# Patient Record
Sex: Male | Born: 1980
Health system: Southern US, Community
[De-identification: ages and names within clinical notes are randomized; demographics above are authoritative.]

---
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
## 2002-11-14 ENCOUNTER — Encounter: Admission: RE | Admit: 2002-11-14 | Discharge: 2002-11-14 | Payer: Self-pay | Admitting: Family Medicine

## 2003-06-27 ENCOUNTER — Encounter: Admission: RE | Admit: 2003-06-27 | Discharge: 2003-06-27 | Payer: Self-pay | Admitting: Family Medicine

## 2007-02-07 ENCOUNTER — Ambulatory Visit: Payer: Self-pay | Admitting: Family Medicine

## 2007-02-08 ENCOUNTER — Encounter: Payer: Self-pay | Admitting: Family Medicine

## 2007-02-08 LAB — CONVERTED CEMR LAB

## 2007-02-28 ENCOUNTER — Ambulatory Visit: Payer: Self-pay | Admitting: Family Medicine

## 2007-02-28 DIAGNOSIS — F17200 Nicotine dependence, unspecified, uncomplicated: Secondary | ICD-10-CM

## 2007-09-06 ENCOUNTER — Ambulatory Visit: Payer: Self-pay | Admitting: Family Medicine

## 2008-12-14 ENCOUNTER — Ambulatory Visit: Payer: Self-pay | Admitting: Family Medicine

## 2008-12-31 ENCOUNTER — Encounter: Payer: Self-pay | Admitting: Family Medicine

## 2009-08-07 ENCOUNTER — Ambulatory Visit: Payer: Self-pay | Admitting: Family Medicine

## 2009-08-07 DIAGNOSIS — R109 Unspecified abdominal pain: Secondary | ICD-10-CM | POA: Insufficient documentation

## 2009-08-07 LAB — CONVERTED CEMR LAB
ALT: 26 units/L (ref 0–53)
AST: 23 units/L (ref 0–37)
Albumin: 4.5 g/dL (ref 3.5–5.2)
Alkaline Phosphatase: 69 units/L (ref 39–117)
Glucose, Bld: 94 mg/dL (ref 70–99)
MCHC: 34.1 g/dL (ref 30.0–36.0)
Platelets: 236 10*3/uL (ref 150–400)
Potassium: 4.1 meq/L (ref 3.5–5.3)
RBC: 4.81 M/uL (ref 4.22–5.81)
Sodium: 135 meq/L (ref 135–145)
Total Protein: 7 g/dL (ref 6.0–8.3)

## 2009-09-18 ENCOUNTER — Ambulatory Visit: Payer: Self-pay | Admitting: Family Medicine

## 2010-03-06 NOTE — Assessment & Plan Note (Signed)
Summary: intestinal prob,df   Vital Signs:  Patient profile:   30 year old male Height:      72 inches Weight:      187.4 pounds BMI:     25.51 Temp:     98.3 degrees F oral Pulse rate:   94 / minute BP sitting:   124 / 76  (left arm) Cuff size:   regular  Vitals Entered By: Garen Grams LPN (August 07, 9145 8:51 AM) CC: GI Issues Is Patient Diabetic? No Pain Assessment Patient in pain? no        CC:  GI Issues.  History of Present Illness: Abdominal discomfort continues to have feeling of constipation being unable to fully evacuate his bowels and deep ache in lower abdominal that is relieved with bowel movements.  Since last visit has changed his diet to include more fruits and veggies and has intentionally lost weight.  His bowel movements are usually soft although can be rabbit like pellets.  No hard painful bowel movements or any bleeding.   No fevers or other types of abdominal pain.  No skin lesions.  Swelling in left testicle has noiticed a slightly tender slightly enlarged L epidydimus over last week.  No sexual contacts or penild discharge or groin swellling or skin lesions or dysyuria    ROS - as above PMH - Medications reviewed and updated in medication list.  Smoking Status noted in VS form    Habits & Providers  Alcohol-Tobacco-Diet     Tobacco Status: current     Tobacco Counseling: to quit use of tobacco products     Cigarette Packs/Day: 0.5  Current Medications (verified): 1)  Ciprofloxacin Hcl 250 Mg Tabs (Ciprofloxacin Hcl) .Marland Kitchen.. 1 By Mouth Two Times A Day  Allergies: No Known Drug Allergies  Social History: Packs/Day:  0.5  Physical Exam  General:  Well-developed,well-nourished,in no acute distress; alert,appropriate and cooperative throughout examination Abdomen:  Bowel sounds positive,abdomen soft and non-tender without masses, organomegaly or hernias noted. Genitalia:  L testicle no masses  Perhaps slightly enlarged epidydimus mildly tender  no erythema R testicle normal     Impression & Recommendations:  Problem # 1:  ABDOMINAL PAIN (ICD-789.00) think most consistent with IBS which he has read about and agrees.  No red flags but will check labs and keep bowel movements diary  Orders: Comp Met-[facility] (82956-21308) CBC-[facility] (65784) [facility]- Est  Level 4 (69629)  Problem # 2:  EPIDIDYMITIS (ICD-604.90) Assessment: New  perhaps mild case.  No sex exposure so will treat with cipro for a course.    Orders: [facility]- Est  Level 4 (52841)  Problem # 3:  TOBACCO USE (ICD-305.1) discussed reasons for smoking and possible substitutes  Complete Medication List: 1)  Ciprofloxacin Hcl 250 Mg Tabs (Ciprofloxacin hcl) .Marland Kitchen.. 1 by mouth two times a day  Patient Instructions: 1)  Please schedule a follow-up appointment in 1 month.  2)  Keep a stool diary - start in 2 weeks - write down each bowel movements - if hard or loose - if any blood.  Also write down times of the most discomfort 3)  I will call you if your lab is abnormal otherwise I will discuss the results during our next visit 4)  Take all the ciprofloxacin - two times a day for 7 days.  If the testicle becomes red orverypainful then call 5)  Consider substitutes for smoking  Prescriptions: CIPROFLOXACIN HCL 250 MG TABS (CIPROFLOXACIN HCL) 1 by mouth two times  a day  #14 x 0   Entered and Authorized by:   Pearlean Brownie MD   Signed by:   Pearlean Brownie MD on 08/07/2009   Method used:   Print then Give to Patient   RxID:   [id number]

## 2010-03-06 NOTE — Assessment & Plan Note (Signed)
Summary: f/up,tcb   Vital Signs:  Patient profile:   30 year old male Height:      72 inches Weight:      188 pounds BMI:     25.59 BSA:     2.08 Temp:     98.2 degrees F Pulse rate:   76 / minute BP sitting:   137 / 74  Vitals Entered By: Jone Baseman CMA (September 18, 2009 11:39 AM) CC: f/u Is Patient Diabetic? No Pain Assessment Patient in pain? no        CC:  f/u.  History of Present Illness: Abdominal pain brought in diary.  Having bowel movements daily of varying consistency and size but no bleeding or diarrhea.  The pain he describes as achy bilateral not very severe.  Does not prevent him from doing anything and he would not take mediine to help it  Smoking  continues 1/2 ppd would like to not smoke but not necesarily want to quit.  First cig is usually on the drive to work.  Understands the health risks  ROS - as above PMH - Medications reviewed and updated in medication list.  Smoking Status noted in VS form    Habits & Providers  Alcohol-Tobacco-Diet     Tobacco Status: current     Tobacco Counseling: to quit use of tobacco products     Cigarette Packs/Day: 0.5  Current Medications (verified): 1)  None  Allergies: No Known Drug Allergies  Physical Exam  General:  Well-developed,well-nourished,in no acute distress; alert,appropriate and cooperative throughout examination   Impression & Recommendations:  Problem # 1:  ABDOMINAL PAIN (ICD-789.00)  very consistent with IBS.  Discussed need for regular fiber and exercise.  No medications desired currently  Orders: [facility]- Est Level  3 (99213)  Problem # 2:  EPIDIDYMITIS (ICD-604.90) resolved  Problem # 3:  TOBACCO USE (ICD-305.1)  discussed reasons and strategies  Orders: [facility]- Est Level  3 (30865)  Problem # 4:  Preventive Health Care (ICD-V70.0) encouraged exercise possibly at local racquet ball court   Problem # 5:  ELEVATED BLOOD PRESSURE WITHOUT DIAGNOSIS OF HYPERTENSION  (ICD-796.2) BP under control last few visits BP today: 137/74 Prior BP: 124/76 (08/07/2009)  Labs Reviewed: Creat: 1.14 (08/07/2009)  Patient Instructions: 1)  Watch your bowel movements if any blood or increased abdominal pain then let us know 2)  Consider stoppign smoking and stress substitutes 3)  Consider exercise the racket club would be a good bet

## 2017-03-15 NOTE — Progress Notes (Signed)
   Subjective   Patient ID: Juan Conley    DOB: 11/07/1980, 37 y.o. male   MRN: [id number]  CC: "Physical exam"  HPI: Juan Conley is a 37 y.o. male who presents to clinic today for the following:  Routine physical: Patient has not been seen in 7 years.  He is here today to reestablish care.  No medical history aside from smoking.  Smoking: Current everyday smoker with 1 pack/day.  Has been smoking for 12 years.  No previous history of cessation.  Not interested in cessation today.  Denies cough, chest pain, shortness of breath, change in weight.  Right ear redness: No previous history of ear infection.  Has endorsed 2 weeks of minimal erythema to right ear without history of trauma.  Patient denies fevers or chills, ear discharge, change in hearing, headache.  ROS: see HPI for pertinent.  PMFSH: Tobacco use disorder.  Surgical history wisdom tooth extraction.  Family history mental illness.  Smoking status reviewed.  Medications reviewed.  Objective   BP 118/68   Pulse 94   Temp 98.4 F (36.9 C) (Oral)   Ht 6' (1.829 m)   Wt 189 lb 6.4 oz (85.9 kg)   SpO2 95%   BMI 25.69 kg/m  Vitals and nursing note reviewed.  General: well nourished, well developed, NAD with non-toxic appearance HEENT: normocephalic, atraumatic, moist mucous membranes, ear piercing bilateral with normal-appearing earlobe without erythema or tenderness, tragus nontender, no ear drainage, TMs gray bilaterally Neck: supple, non-tender without lymphadenopathy Cardiovascular: regular rate and rhythm without murmurs, rubs, or gallops Lungs: clear to auscultation bilaterally with normal work of breathing Abdomen: soft, non-tender, non-distended, normoactive bowel sounds Skin: warm, dry, no rashes or lesions, cap refill < 2 seconds Extremities: warm and well perfused, normal tone, no edema  Assessment & Plan   Tobacco use disorder Chronic.  Current everyday smoker.  Has 12-pack-year history.  No  history of cessation attempts.  Not interested in cessation today. - Discussed effects of cigarette smoking given information for smoking cessation  Erythema of ear Acute.  Limited to right earlobe.  Does have pierced ears with gage.  Ear unremarkable on exam.  No recent trauma to ear. - Advised patient to follow-up if pain continues  No orders of the defined types were placed in this encounter.  No orders of the defined types were placed in this encounter.   Durward Parcelavid McMullen, DO [facility], PGY-2 03/16/2017, 12:13 PM

## 2017-03-16 ENCOUNTER — Ambulatory Visit: Payer: Self-pay | Admitting: Family Medicine

## 2017-03-16 ENCOUNTER — Encounter: Payer: Self-pay | Admitting: Family Medicine

## 2017-03-16 ENCOUNTER — Other Ambulatory Visit: Payer: Self-pay

## 2017-03-16 VITALS — BP 118/68 | HR 94 | Temp 98.4°F | Ht 72.0 in | Wt 189.4 lb

## 2017-03-16 DIAGNOSIS — Z0000 Encounter for general adult medical examination without abnormal findings: Secondary | ICD-10-CM

## 2017-03-16 DIAGNOSIS — L538 Other specified erythematous conditions: Secondary | ICD-10-CM | POA: Insufficient documentation

## 2017-03-16 DIAGNOSIS — F17200 Nicotine dependence, unspecified, uncomplicated: Secondary | ICD-10-CM

## 2017-03-16 NOTE — Assessment & Plan Note (Addendum)
Chronic.  Current everyday smoker.  Has 12-pack-year history.  No history of cessation attempts.  Not interested in cessation today. - Discussed effects of cigarette smoking given information for smoking cessation

## 2017-03-16 NOTE — Patient Instructions (Signed)
Thank you for coming in to see us today. Please see below to review our plan for today's visit.  Your biggest problem is her smoking use.  Over time, this will have detrimental effects to your health.  If you are interested in cessation you can call 1-800-QUIT-NOW.  Please let me know at any point if you are interested in cessation.  We have multiple resources to help assist you with this.  Continue exercising regularly.  It would be beneficial if you incorporate some form of cardio.    Please call the clinic at [phone] if your symptoms worsen or you have any concerns. It was our pleasure to serve you.  Durward Parcelavid Aliani Caccavale, DO [facility], PGY-2

## 2017-03-16 NOTE — Assessment & Plan Note (Addendum)
Acute.  Limited to right earlobe.  Does have pierced ears with gage.  Ear unremarkable on exam.  No recent trauma to ear. - Advised patient to follow-up if pain continues

## 2018-02-02 HISTORY — PX: SPINE SURGERY: SHX786

## 2018-03-09 ENCOUNTER — Encounter: Payer: Self-pay | Admitting: Family Medicine

## 2018-03-09 ENCOUNTER — Ambulatory Visit (INDEPENDENT_AMBULATORY_CARE_PROVIDER_SITE_OTHER): Payer: PRIVATE HEALTH INSURANCE | Admitting: Family Medicine

## 2018-03-09 VITALS — BP 122/80 | HR 113 | Temp 98.4°F | Wt 217.0 lb

## 2018-03-09 DIAGNOSIS — M25511 Pain in right shoulder: Secondary | ICD-10-CM

## 2018-03-09 DIAGNOSIS — Z789 Other specified health status: Secondary | ICD-10-CM

## 2018-03-09 DIAGNOSIS — G8929 Other chronic pain: Secondary | ICD-10-CM

## 2018-03-09 DIAGNOSIS — Z0000 Encounter for general adult medical examination without abnormal findings: Secondary | ICD-10-CM | POA: Insufficient documentation

## 2018-03-09 DIAGNOSIS — Z7289 Other problems related to lifestyle: Secondary | ICD-10-CM

## 2018-03-09 NOTE — Assessment & Plan Note (Signed)
Primary concern is right shoulder.  Discontinued tobacco use, now on vaper.  Remains physically active. - RTC 1 year or sooner if needed - Declined flu shot

## 2018-03-09 NOTE — Patient Instructions (Signed)
Thank you for coming in to see us today. Please see below to review our plan for today's visit.  1.  I placed a referral for sports medicine evaluation.  They will contact you in the coming weeks to schedule the appointment.  You can consider conservative management with icing, rest, and over-the-counter medications such as Tylenol and Motrin/Advil. 2.  I am pleased to hear that you are able to get off cigarettes, however if some point you were interested in discontinuing vaping, please let me know. 3.  We will see you again in 1 year or sooner if needed.  Please call the clinic at [phone] if your symptoms worsen or you have any concerns. It was our pleasure to serve you.  Durward Parcelavid McMullen, DO [facility], PGY-3

## 2018-03-09 NOTE — Progress Notes (Signed)
Subjective   Patient ID: Juan Conley    DOB: 18-Aug-1980, 38 y.o. male   MRN: [id number]  CC: "Annual physical"  HPI: Juan Conley is a 38 y.o. male who presents to clinic today for the following:  Annual physical exam: Patient presents today for his annual wellness physical.  He does have concerns regarding his chronic right shoulder pain.  He is a former smoker but was able to discontinue cigarette use in June of last year.  He has replaced his cigarettes with non-nicotine vaping.  He is not interested in discontinuing today.  He is overdue for his annual flu shot but not interested in vaccination today.  Patient denies history of fevers or chills, nausea or vomiting, fatigue or syncope, chest pain or shortness of breath, palpitations, abdominal pain, constipation or diarrhea.  Chronic right shoulder pain: Patient reports shoulder pain specifically in the posterior right deltoid and right trapezius since April 2019.  Patient remembers waking up 1 morning and feeling an aching pain in his right shoulder after intense workout.  He denies any popping or snapping sensation.  He denies trauma.  He has no prior history of shoulder pain or surgery.  Pain is constant and varies in severity daily.  He is tried conservative management with over-the-counter medications such as Tylenol to use ibuprofen and other modalities such as acupuncture and chiropractor without success.  He is hopeful to see sports medicine given his frequent activity in the gym approximately 5 times per week with heavy weightlifting.  He denies any weakness in his arm or grip strength, loss of sensation, swelling.  ROS: see HPI for pertinent.  PMFSH: Tobacco use disorder.  Surgical history wisdom tooth extraction.  Family history mental illness. Smoking status reviewed. Medications reviewed.  Objective   BP 122/80   Pulse (!) 113   Temp 98.4 F (36.9 C) (Oral)   Wt 217 lb (98.4 kg)   SpO2 95%   BMI 29.43 kg/m    Vitals and nursing note reviewed.  General: well nourished, well developed, NAD with non-toxic appearance HEENT: normocephalic, atraumatic, moist mucous membranes Neck: supple, non-tender without lymphadenopathy Cardiovascular: regular rate and rhythm without murmurs, rubs, or gallops Lungs: clear to auscultation bilaterally with normal work of breathing Abdomen: soft, non-tender, non-distended, normoactive bowel sounds GU: declined testicular exam Skin: warm, dry, no rashes or lesions, cap refill < 2 seconds Extremities: warm and well perfused, normal tone, no edema, 5/5 motor strength on upper extremities bilaterally, minimal tenderness to proximal trapezius, no tenderness to biceps or AC joint, active and passive ROM intact, neurovascularly intact  Assessment & Plan   Encounter for annual physical exam Primary concern is right shoulder.  Discontinued tobacco use, now on vaper.  Remains physically active. - RTC 1 year or sooner if needed - Declined flu shot  Chronic pain in right shoulder Chronic.  Does not seem consistent with rotator cuff injury.  Suspect underlying trapezius strain versus tear could be contributing to pain.  Patient has failed conservative therapy.  Physical exercise is very important to him and he has made several attempts to decrease his activity level without improvement. - Ambulatory referral to sports medicine  Orders Placed This Encounter  Procedures  . Ambulatory referral to Sports Medicine    Referral Priority:   Routine    Referral Type:   Consultation    Number of Visits Requested:   1   No orders of the defined types were placed in this encounter.  Durward Parcel, DO [facility], PGY-3 03/09/2018, 12:03 PM

## 2018-03-09 NOTE — Assessment & Plan Note (Signed)
Chronic.  Does not seem consistent with rotator cuff injury.  Suspect underlying trapezius strain versus tear could be contributing to pain.  Patient has failed conservative therapy.  Physical exercise is very important to him and he has made several attempts to decrease his activity level without improvement. - Ambulatory referral to sports medicine

## 2018-03-15 ENCOUNTER — Ambulatory Visit (INDEPENDENT_AMBULATORY_CARE_PROVIDER_SITE_OTHER): Payer: PRIVATE HEALTH INSURANCE | Admitting: Family Medicine

## 2018-03-15 VITALS — BP 130/90 | Ht 72.0 in | Wt 217.0 lb

## 2018-03-15 DIAGNOSIS — M25511 Pain in right shoulder: Secondary | ICD-10-CM | POA: Diagnosis not present

## 2018-03-15 DIAGNOSIS — G8929 Other chronic pain: Secondary | ICD-10-CM

## 2018-03-15 MED ORDER — METHOCARBAMOL 500 MG PO TABS: 500.0000 mg | ORAL_TABLET | Freq: Three times a day (TID) | ORAL | 1 refills | Status: DC | PRN

## 2018-03-15 MED FILL — METHOCARBAMOL 500 MG TABS: 500 | 20 days supply | Qty: 60 | Fill #0

## 2018-03-15 NOTE — Patient Instructions (Signed)
I'm concerned you have an irritated nerve in your neck. Aleve 2 tabs twice a day with food as needed for pain and inflammation Robaxin three times a day as needed for muscle spasms (do not drive with this if it makes you sleepy). Simple range of motion exercises within limits of pain to prevent further stiffness. Start physical therapy for stretching, exercises, traction, and modalities. Heat 15 minutes at a time 3-4 times a day to help with spasms. Watch head position when on computers, texting, when sleeping in bed - should in line with back to prevent further nerve traction and irritation. Consider home traction unit if you get benefit with this in physical therapy. If not improving we will consider an MRI. Follow up with me in 6 weeks.

## 2018-03-16 ENCOUNTER — Encounter: Payer: Self-pay | Admitting: Family Medicine

## 2018-03-16 NOTE — Progress Notes (Signed)
PCP and consultation requested by: Wendee BeaversMcMullen, David J, DO  Subjective:   HPI: Patient is a 38 y.o. male here for right shoulder/neck pain.  Patient reports dating back to April 2019 he's had pain he feels more in the right trapezius area of his neck/posterior shoulder. No acute injury or trauma - woke up with the pain one day. Works out regularly but has had to back off upper body/shoulder/chest/back workouts because of this. He's tried chiropractic care, working with a Systems analystpersonal trainer, dry needling, and massage. No radiation into his arm. No numbness or tingling. No bowel/bladder dysfunction. Pain can be sharp and is constant. Tried tylenol, ibuprofen without much benefit. Able to do bench and pushups without feeling of weakness. Thinks initially this was more posterior to the shoulder laterally.  History reviewed. No pertinent past medical history.  No current outpatient medications on file prior to visit.   No current facility-administered medications on file prior to visit.     Past Surgical History:  Procedure Laterality Date  . WISDOM TOOTH EXTRACTION Bilateral 2001    No Known Allergies  Social History   Socioeconomic History  . Marital status: Single    Spouse name: Not on file  . Number of children: 0  . Years of education: College  . Highest education level: Bachelor's degree (e.g., BA, AB, BS)  Occupational History  . Occupation: Security    Comment: [employer]  Social Needs  . Financial resource strain: Not hard at all  . Food insecurity:    Worry: Never true    Inability: Never true  . Transportation needs:    Medical: No    Non-medical: No  Tobacco Use  . Smoking status: Current Every Day Smoker    Packs/day: 1.00    Years: 12.00    Pack years: 12.00    Types: Cigarettes  . Smokeless tobacco: Never Used  Substance and Sexual Activity  . Alcohol use: Yes    Comment: Seldom use  . Drug use: No  . Sexual activity: Never  Lifestyle  . Physical  activity:    Days per week: 5 days    Minutes per session: 90 min  . Stress: Only a little  Relationships  . Social connections:    Talks on phone: Not on file    Gets together: Not on file    Attends religious service: Not on file    Active member of club or organization: Not on file    Attends meetings of clubs or organizations: Not on file    Relationship status: Not on file  . Intimate partner violence:    Fear of current or ex partner: Not on file    Emotionally abused: Not on file    Physically abused: Not on file    Forced sexual activity: Not on file  Other Topics Concern  . Not on file  Social History Narrative   [place]ives [place] in [place]     Family History  Problem Relation Age of Onset  . Mental illness Mother        Uncertain, schizoaffective or bipolar    BP 130/90   Ht 6' (1.829 m)   Wt 217 lb (98.4 kg)   BMI 29.43 kg/m   Review of Systems: See HPI above.     Objective:  Physical Exam:  Gen: NAD, comfortable in exam room  Neck: No gross deformity, swelling, bruising. TTP mildly right cervical paraspinal region/trapezius.  No midline/bony TTP. FROM without pain. BUE strength 5/5.  Sensation intact to light touch.   2+ equal reflexes in triceps, biceps, brachioradialis tendons. Negative spurlings, Adson. NV intact distal BUEs.  Right shoulder: No swelling, ecchymoses.  No gross deformity. No TTP. FROM without pain. Negative Hawkins, Neers. Negative Yergasons. Strength 5/5 with empty can and resisted internal/external rotation. Negative apprehension. Negative O'brien. NV intact distally.   Assessment & Plan:  1. Neck/posterior shoulder pain - Patient's exam is reassuring.  Location of pain consistent with etiology from cervical spine and not shoulder.  He's tried some conservative measures including OTC meds, chiropractic care, dry needling, working with personal trainer, massage.  Advised we go ahead with physical therapy with  aleve and robaxin.  Heat for spasms.  Discussed ergonomic issues.  If not improving would consider cervical spine imaging.  F/u in 6 weeks.

## 2018-03-18 ENCOUNTER — Ambulatory Visit: Payer: PRIVATE HEALTH INSURANCE | Admitting: Physical Therapy

## 2018-03-18 ENCOUNTER — Encounter: Payer: Self-pay | Admitting: Physical Therapy

## 2018-03-18 NOTE — Therapy (Signed)
[facility] [address] [place], [place], [address] Phone: [phone]   Fax:  [phone]  Physical Therapy Evaluation  Patient Details  Name: KAYD BEVERIDGE MRN: [id number] Date of Birth: [birth year] No data recorded  Encounter Date: 03/18/2018  PT End of Session - 03/18/18 0755    Visit Number  1    PT Start Time  0758    Activity Tolerance  Patient tolerated treatment well    Behavior During Therapy  [facility] for tasks assessed/performed       History reviewed. No pertinent past medical history.  Past Surgical History:  Procedure Laterality Date  . WISDOM TOOTH EXTRACTION Bilateral 2001    There were no vitals filed for this visit.                  Objective measurements completed on examination: See above findings.                           Plan - 03/18/18 0805    Clinical Impression Statement  pt arrived for evaluation but due to his medical insurance being out of network opted to not go through with the evaluation.        Patient will benefit from skilled therapeutic intervention in order to improve the following deficits and impairments:     Visit Diagnosis: Chronic right shoulder pain  Cervicalgia     Problem List Patient Active Problem List   Diagnosis Date Noted  . Non-nicotine vapor product user 03/09/2018  . Chronic pain in right shoulder 03/09/2018  . Encounter for annual physical exam 03/09/2018   Lulu Riding PT, DPT, LAT, ATC  03/18/18  8:09 AM      [facility] [address] [place], [place], [address] Phone: [phone]   Fax:  [phone]  Name: MAJD BLUMER MRN: [id number] Date of Birth: Jan 23, 1981

## 2018-04-26 ENCOUNTER — Ambulatory Visit (INDEPENDENT_AMBULATORY_CARE_PROVIDER_SITE_OTHER): Payer: PRIVATE HEALTH INSURANCE | Admitting: Family Medicine

## 2018-04-26 ENCOUNTER — Other Ambulatory Visit: Payer: Self-pay

## 2018-04-26 ENCOUNTER — Encounter: Payer: Self-pay | Admitting: Family Medicine

## 2018-04-26 VITALS — BP 139/77 | Ht 72.0 in | Wt 210.0 lb

## 2018-04-26 DIAGNOSIS — M542 Cervicalgia: Secondary | ICD-10-CM

## 2018-04-26 NOTE — Progress Notes (Signed)
PCP and consultation requested by: Wendee Beavers, DO  Subjective:   HPI: Patient is a 38 y.o. male here for right shoulder/neck pain.  2/11: Patient reports dating back to April 2019 he's had pain he feels more in the right trapezius area of his neck/posterior shoulder. No acute injury or trauma - woke up with the pain one day. Works out regularly but has had to back off upper body/shoulder/chest/back workouts because of this. He's tried chiropractic care, working with a Systems analyst, dry needling, and massage. No radiation into his arm. No numbness or tingling. No bowel/bladder dysfunction. Pain can be sharp and is constant. Tried tylenol, ibuprofen without much benefit. Able to do bench and pushups without feeling of weakness. Thinks initially this was more posterior to the shoulder laterally.  3/24: Patient reports mild improvement compared to last visit. Pain level 0-1/10 for the most part - does have several days where pain is minimal in a row now. Using a traction pillow. Takes aleve, robaxin if needed. Pain still on right side of neck/trap area. No numbness. No radiation into extremities.  History reviewed. No pertinent past medical history.  Current Outpatient Medications on File Prior to Visit  Medication Sig Dispense Refill  . methocarbamol (ROBAXIN) 500 MG tablet Take 1 tablet (500 mg total) by mouth every 8 (eight) hours as needed. 60 tablet 1   No current facility-administered medications on file prior to visit.     Past Surgical History:  Procedure Laterality Date  . WISDOM TOOTH EXTRACTION Bilateral 2001    No Known Allergies  Social History   Socioeconomic History  . Marital status: Single    Spouse name: Not on file  . Number of children: 0  . Years of education: College  . Highest education level: Bachelor's degree (e.g., BA, AB, BS)  Occupational History  . Occupation: Security    Comment: [employer]  Social Needs  . Financial  resource strain: Not hard at all  . Food insecurity:    Worry: Never true    Inability: Never true  . Transportation needs:    Medical: No    Non-medical: No  Tobacco Use  . Smoking status: Current Every Day Smoker    Packs/day: 1.00    Years: 12.00    Pack years: 12.00    Types: Cigarettes  . Smokeless tobacco: Never Used  Substance and Sexual Activity  . Alcohol use: Yes    Comment: Seldom use  . Drug use: No  . Sexual activity: Never  Lifestyle  . Physical activity:    Days per week: 5 days    Minutes per session: 90 min  . Stress: Only a little  Relationships  . Social connections:    Talks on phone: Not on file    Gets together: Not on file    Attends religious service: Not on file    Active member of club or organization: Not on file    Attends meetings of clubs or organizations: Not on file    Relationship status: Not on file  . Intimate partner violence:    Fear of current or ex partner: Not on file    Emotionally abused: Not on file    Physically abused: Not on file    Forced sexual activity: Not on file  Other Topics Concern  . Not on file  Social History Narrative   Lives with parents in [place].     Family History  Problem Relation Age of Onset  . Mental  illness Mother        Uncertain, schizoaffective or bipolar    BP 139/77   Ht 6' (1.829 m)   Wt 210 lb (95.3 kg)   BMI 28.48 kg/m   Review of Systems: See HPI above.     Objective:  Physical Exam:  Gen: NAD, comfortable in exam room  Neck: No gross deformity, swelling, bruising. TTP minimally right trapezius/cervical paraspinal region.  No midline/bony TTP. FROM. BUE strength 5/5.   Sensation intact to light touch.   2+ equal reflexes in triceps, biceps, brachioradialis tendons. Negative spurlings. NV intact distal BUEs.   Assessment & Plan:  1. Neck/posterior shoulder pain - exam again reassuring.  Mild improvement since last visit.  Physical therapy not covered by his  insurance unfortunately.  Discussed MRI but this is non-urgent so could not obtain at this time.  He will continue with the traction pillow, home exercises, aleve and robaxin as needed, heat if needed.  Let us know how he's doing in 3-4 weeks.

## 2018-04-26 NOTE — Patient Instructions (Signed)
Continue using your traction pillow. Aleve, robaxin if needed. Heat 15 minutes at a time as needed. Exercise as tolerated. Let me know how you're doing in about 3-4 weeks. Next steps would be either physical therapy (though not covered by your insurance) or cervical spine imaging once the ban is lifted on non-urgent MRIs.

## 2018-06-13 ENCOUNTER — Telehealth: Payer: Self-pay | Admitting: *Deleted

## 2018-06-13 DIAGNOSIS — M542 Cervicalgia: Secondary | ICD-10-CM

## 2018-06-13 NOTE — Telephone Encounter (Signed)
MRI order placed and patient to call and set up MRI

## 2018-07-06 ENCOUNTER — Other Ambulatory Visit: Payer: Self-pay

## 2018-07-06 ENCOUNTER — Ambulatory Visit
Admission: RE | Admit: 2018-07-06 | Discharge: 2018-07-06 | Disposition: A | Discharge status: Home / Self Care | Payer: Self-pay | Source: Ambulatory Visit | Attending: Family Medicine | Admitting: Family Medicine

## 2018-07-06 DIAGNOSIS — M542 Cervicalgia: Secondary | ICD-10-CM

## 2018-07-11 ENCOUNTER — Other Ambulatory Visit: Payer: Self-pay | Admitting: Family Medicine

## 2018-07-11 ENCOUNTER — Other Ambulatory Visit: Payer: Self-pay | Admitting: *Deleted

## 2018-07-11 DIAGNOSIS — M542 Cervicalgia: Secondary | ICD-10-CM

## 2018-07-29 ENCOUNTER — Other Ambulatory Visit: Payer: Self-pay

## 2018-08-12 ENCOUNTER — Other Ambulatory Visit: Payer: Self-pay

## 2018-08-12 ENCOUNTER — Ambulatory Visit
Admission: RE | Admit: 2018-08-12 | Discharge: 2018-08-12 | Disposition: A | Discharge status: Home / Self Care | Payer: Self-pay | Source: Ambulatory Visit | Attending: Family Medicine | Admitting: Family Medicine

## 2018-08-12 DIAGNOSIS — M542 Cervicalgia: Secondary | ICD-10-CM

## 2018-08-12 MED ORDER — TRIAMCINOLONE ACETONIDE 40 MG/ML IJ SUSP (RADIOLOGY)
60.0000 mg | Freq: Once | INTRAMUSCULAR | Status: AC
  Administered 2018-08-12: 08:00:00 60 mg via EPIDURAL

## 2018-08-12 MED ORDER — IOPAMIDOL (ISOVUE-M 300) INJECTION 61%
1.0000 mL | Freq: Once | INTRAMUSCULAR | Status: AC
  Administered 2018-08-12: 1 mL via EPIDURAL

## 2018-08-12 NOTE — Discharge Instructions (Signed)

## 2019-06-20 ENCOUNTER — Other Ambulatory Visit: Payer: Self-pay

## 2019-06-20 ENCOUNTER — Encounter: Payer: Self-pay | Admitting: Family Medicine

## 2019-06-20 ENCOUNTER — Ambulatory Visit (INDEPENDENT_AMBULATORY_CARE_PROVIDER_SITE_OTHER): Payer: PRIVATE HEALTH INSURANCE | Admitting: Family Medicine

## 2019-06-20 VITALS — BP 142/80 | HR 105 | Ht 72.0 in | Wt 210.2 lb

## 2019-06-20 DIAGNOSIS — J310 Chronic rhinitis: Secondary | ICD-10-CM

## 2019-06-20 DIAGNOSIS — G5732 Lesion of lateral popliteal nerve, left lower limb: Secondary | ICD-10-CM

## 2019-06-20 DIAGNOSIS — M79672 Pain in left foot: Secondary | ICD-10-CM

## 2019-06-20 DIAGNOSIS — Z0000 Encounter for general adult medical examination without abnormal findings: Secondary | ICD-10-CM | POA: Diagnosis not present

## 2019-06-20 DIAGNOSIS — Z23 Encounter for immunization: Secondary | ICD-10-CM | POA: Diagnosis not present

## 2019-06-20 DIAGNOSIS — J329 Chronic sinusitis, unspecified: Secondary | ICD-10-CM

## 2019-06-20 DIAGNOSIS — F17200 Nicotine dependence, unspecified, uncomplicated: Secondary | ICD-10-CM

## 2019-06-20 DIAGNOSIS — G8929 Other chronic pain: Secondary | ICD-10-CM | POA: Insufficient documentation

## 2019-06-20 NOTE — Progress Notes (Signed)
Subjective:   Patient ID: Juan Conley    DOB: 24-Feb-1980, 39 y.o. male   MRN: [id number]  Juan Conley is a 39 y.o. male with a history of chronic pain in left foot, tobacco use disorder here for annual wellness visit.  Annual Physical: Patient presents today for his annual wellness physical.  Concerns today include persistent sinus issues and chronic intermittent left foot pain. Patient denies any history of fevers or chills, nausea or vomiting, fatigue or syncope, chest pain or shortness of breath, palpitations, abdominal pain, constipation, or diarrhea.  Sinuses Patient endorses persistent sinus issues. He notes he has postnasal drip and occasional sinus pressure. June he noted that is was really bad. He does note that it has lessened some over the last 5 months. He went to urgent care at one point to be checked out. He was treated with Penicillin, steroid, and Flonase. He notes the Flonase is helpful. He has also tried the [employer] BID and hydrogen peroxide. He notes he has tried Claritin x 3 weeks and Zyrtec 2 weeks - neither helped well. He may have tried Mucinex. He really desires a referral to allergist for possible testing because it is persistent and year long.  Denies any rashes. Denies any food allergies.   Left foot Pain: Patient endorses chronic intermittent left foot numbness/tingling located primarily in the medial and portions of the dorsal foot x 1 year. Occasionally has intermittent pain along the anterior aspects of the ankle joint.  He notes that he weight lifts and at times will have no pain or symptoms at all.  He is unable to determine what makes it feel better or worse.  He does note that he has used an orthopedic boot that pumps up and compression socks that help a little bit.  He also notes that orthotics in his tennis shoes have helped as well.  He has not treated it with any medications. Denies any swelling, redness, or warmth. Denies any trauma.   Health  Maintenance: Due for Tdap and Covid vaccine. He had COVID virus in Jan 2021. Plans to get TDAP today. Plans to get COVID vaccine later in the year.  Social History:  Smoker: He currently smokes 1/2 pack/day. He has cut back from a full pack in the last 3 months. He is working on cutting back to a 1/4 a pack. He hopes to achieve this by 1-2 weeks. He notes that nicotine replacement has not been helpful in the past and declines it today. He has never tried Wellbutrin or Chantix. He notes that it is less about the nicotine addiction and more of the habit that he has to break. He is very motivated. Alcohol: Seldom use. Illicit drugs: None Sexually active: abstinent   Review of Systems:  Per HPI.   Objective:   BP (!) 142/80   Pulse (!) 105   Ht 6' (1.829 m)   Wt 210 lb 3.2 oz (95.3 kg)   SpO2 96%   BMI 28.51 kg/m  Vitals and nursing note reviewed.  General: Pleasant no age male, sitting complaint exam chair, well nourished, well developed, in no acute distress with non-toxic appearance HEENT: normocephalic, atraumatic, moist mucous membranes, throat mildly erythematous otherwise no exudates or other signs of infection CV: regular rate and rhythm without murmurs, rubs, or gallops, no lower extremity edema Lungs: clear to auscultation bilaterally with normal work of breathing Abdomen: soft, non-tender Skin: warm, dry Extremities: warm and well perfused MSK: gait normal Neuro:  Alert and oriented, speech normal  Left Foot: Inspection:  No obvious bony deformity.  No swelling, erythema, or bruising. Pes planus with medial ankle rotation. Palpation: No tenderness to palpation  ROM: Full  ROM of the ankle. Normal midfoot flexibility Strength: 5/5 strength ankle in all planes Neurovascular: N/V intact distally in the lower extremity Special tests: Negative anterior drawer. Negative squeeze. normal midfoot flexibility. Normal calcaneal motion with heel raise. (+) Tinel's at anterior ankle  reproducing symptoms  Assessment & Plan:   Tobacco use disorder Patient is currently an everyday smoker. He has cut back from 1 PPD to 1/2 PPD. He is currently working on cutting back to a 1/4th of a pack in the next 2 weeks. He is not interested in nicotine replacement, Chantix, or wellbutrin at this time.  Congratulated patient on his success thus far.  Patient was counseled on the risks of tobacco use and cessation strongly encouraged. Recommended he contact me if further assistance is needed.  Chronic sinusitis Patient complains of symptoms consistent with acute on chronic rhinosinusitis. He has tried intermittent use of OTC antihistamines and flonase with minimal benefit. He has never had allergy testing. He is very interested in referral to allergist. He is amendable to treating until he can be seen by allergist.  - recommended environmental control measures - Recommended daily OTC over the counter antihistamines such as Zyrtec (cetirizine), Claritin (loratadine), Allegra (fexofenadine), or Xyzal (levocetirizine) daily as needed. May take it twice a day if needed. - Flonase daily - Nasal saline spray (i.e., Simply Saline) or nasal saline lavage (i.e., NeilMed) is recommended as needed and prior to medicated nasal sprays. - Allergy referral placed   Superficial peroneal nerve neuropathy, left Symptoms and physical exam consistent with superficial peroneal nerve neuropathy. Unclear etiology other than possibly secondary to foot position leading to ankle rotation and nerve irritation/compression. - Given chronicity, will obtain left foot x-rays.  - Recommended continuing orthotics, OTC topical analgesics such as Voltaren gel and capscasin cream. Continue compression PRN.  - If no improvement in 4-6 weeks with above measures, will refer to sports medicine. Can consider orthotics vs nerve conduction studies vs ultrasound at that time.  Health Maintenance: Received TDAP today Plans to obtain  COVID vaccine later in the year. Recently had COVID infection in January 2021   Orders Placed This Encounter  Procedures  . DG Foot Complete Left    Standing Status:   Future    Number of Occurrences:   1    Standing Expiration Date:   08/19/2020    Order Specific Question:   Reason for Exam (SYMPTOM  OR DIAGNOSIS REQUIRED)    Answer:   left dorsal foot pain    Order Specific Question:   Preferred imaging location?    Answer:   [facility]    Order Specific Question:   Radiology Contrast Protocol - do NOT remove file path    Answer:   [email or web address]  . Tdap vaccine greater than or equal to 7yo IM  . Ambulatory referral to Allergy    Referral Priority:   Routine    Referral Type:   Allergy Testing    Referral Reason:   Specialty Services Required    Requested Specialty:   Allergy    Number of Visits Requested:   1   No orders of the defined types were placed in this encounter.   Mina Marble, DO PGY-2, [facility] 06/21/2019 11:03 AM

## 2019-06-20 NOTE — Patient Instructions (Signed)
Thank you for coming to see me today. It was a pleasure to see you.   Congratulations on cutting down on smoking! Please let me know if I can do to help with this journey.  Today you received your TDAP vaccine  I have referred you to an allergiest. In the mean time, I recommend you take Claritin/Zyrtec daily, Mucinex and Flonase.   Please call and schedule an appointment with the sports medicine clinic for further evaluation of your left foot. You can try Voltaren gel and/or capscasin cream 3-4 times a day to the area.   Please follow-up with me  in 1 year.   If you have any questions or concerns, please do not hesitate to call the office at [phone].  Take Care,   Dr. Orpah Cobb, DO Resident Physician [facility] [phone]

## 2019-06-21 ENCOUNTER — Ambulatory Visit (HOSPITAL_COMMUNITY)
Admission: RE | Admit: 2019-06-21 | Discharge: 2019-06-21 | Disposition: A | Discharge status: Home / Self Care | Payer: PRIVATE HEALTH INSURANCE | Source: Ambulatory Visit | Attending: Family Medicine | Admitting: Family Medicine

## 2019-06-21 DIAGNOSIS — M79672 Pain in left foot: Secondary | ICD-10-CM | POA: Insufficient documentation

## 2019-06-21 DIAGNOSIS — G5732 Lesion of lateral popliteal nerve, left lower limb: Secondary | ICD-10-CM | POA: Insufficient documentation

## 2019-06-21 DIAGNOSIS — J329 Chronic sinusitis, unspecified: Secondary | ICD-10-CM | POA: Insufficient documentation

## 2019-06-21 DIAGNOSIS — G8929 Other chronic pain: Secondary | ICD-10-CM | POA: Diagnosis present

## 2019-06-21 NOTE — Assessment & Plan Note (Signed)
Patient complains of symptoms consistent with acute on chronic rhinosinusitis. He has tried intermittent use of OTC antihistamines and flonase with minimal benefit. He has never had allergy testing. He is very interested in referral to allergist. He is amendable to treating until he can be seen by allergist.  - recommended environmental control measures - Recommended daily OTC over the counter antihistamines such as Zyrtec (cetirizine), Claritin (loratadine), Allegra (fexofenadine), or Xyzal (levocetirizine) daily as needed. May take it twice a day if needed. - Flonase daily - Nasal saline spray (i.e., Simply Saline) or nasal saline lavage (i.e., NeilMed) is recommended as needed and prior to medicated nasal sprays. - Allergy referral placed

## 2019-06-21 NOTE — Assessment & Plan Note (Addendum)
Symptoms and physical exam consistent with superficial peroneal nerve neuropathy. Unclear etiology other than possibly secondary to foot position leading to ankle rotation and nerve irritation/compression. - Given chronicity, will obtain left foot x-rays.  - Recommended continuing orthotics, OTC topical analgesics such as Voltaren gel and capscasin cream. Continue compression PRN.  - If no improvement in 4-6 weeks with above measures, will refer to sports medicine. Can consider orthotics vs nerve conduction studies vs ultrasound at that time.

## 2019-06-21 NOTE — Assessment & Plan Note (Signed)
Patient is currently an everyday smoker. He has cut back from 1 PPD to 1/2 PPD. He is currently working on cutting back to a 1/4th of a pack in the next 2 weeks. He is not interested in nicotine replacement, Chantix, or wellbutrin at this time.  Congratulated patient on his success thus far.  Patient was counseled on the risks of tobacco use and cessation strongly encouraged. Recommended he contact me if further assistance is needed.

## 2019-06-22 ENCOUNTER — Encounter: Payer: Self-pay | Admitting: Family Medicine

## 2019-08-17 ENCOUNTER — Encounter: Payer: Self-pay | Admitting: Allergy & Immunology

## 2019-08-17 ENCOUNTER — Other Ambulatory Visit: Payer: Self-pay

## 2019-08-17 ENCOUNTER — Ambulatory Visit (INDEPENDENT_AMBULATORY_CARE_PROVIDER_SITE_OTHER): Payer: PRIVATE HEALTH INSURANCE | Admitting: Allergy & Immunology

## 2019-08-17 VITALS — BP 140/80 | HR 107 | Temp 98.5°F | Ht 70.5 in | Wt 211.0 lb

## 2019-08-17 DIAGNOSIS — J302 Other seasonal allergic rhinitis: Secondary | ICD-10-CM

## 2019-08-17 DIAGNOSIS — J3089 Other allergic rhinitis: Secondary | ICD-10-CM | POA: Diagnosis not present

## 2019-08-17 MED ORDER — AZELASTINE HCL 0.1 % NA SOLN: NASAL | 5 refills | Status: DC

## 2019-08-17 NOTE — Patient Instructions (Addendum)
1. Seasonal and perennial allergic rhinitis - Testing today showed: indoor molds, outdoor molds, dust mites, cat, dog and cockroach - Copy of test results provided.  - Avoidance measures provided. - Continue with: Flonase (fluticasone) two sprays per nostril daily - Start taking: Allegra (fexofenadine) 180mg  tablet once daily OR Xyzal (levocetirizine) 5mg  tablet once daily in combination with Astelin (azelastine) 2 sprays per nostril 1-2 times daily as needed - You can use an extra dose of the antihistamine, if needed, for breakthrough symptoms.  - Consider nasal saline rinses 1-2 times daily to remove allergens from the nasal cavities as well as help with mucous clearance (this is especially helpful to do before the nasal sprays are given) - Consider allergy shots as a means of long-term control. - Allergy shots "re-train" and "reset" the immune system to ignore environmental allergens and decrease the resulting immune response to those allergens (sneezing, itchy watery eyes, runny nose, nasal congestion, etc).    - Allergy shots improve symptoms in 75-85% of patients.  - We can discuss more at the next appointment if the medications are not working for you.  2. Return in about 6 weeks (around 09/28/2019). This can be an in-person, a virtual Webex or a telephone follow up visit.   Please inform 1993 of any Emergency Department visits, hospitalizations, or changes in symptoms. Call 09/30/2019 before going to the ED for breathing or allergy symptoms since we might be able to fit you in for a sick visit. Feel free to contact us anytime with any questions, problems, or concerns.  It was a pleasure to meet you today!  Websites that have reliable patient information: 1. American Academy of Asthma, Allergy, and Immunology: www.aaaai.org 2. Food Allergy Research and Education (FARE): foodallergy.org 3. Mothers of Asthmatics: http://www.asthmacommunitynetwork.org 4. American College of Allergy, Asthma, and  Immunology: www.acaai.org   COVID-19 Vaccine Information can be found at: [place] For questions related to vaccine distribution or appointments, please email [email or web address] or call [phone].     "Like" [email or web address] on Facebook and Instagram for our latest updates!        Make sure you are registered to vote! If you have moved or changed any of your contact information, you will need to get this updated before voting!  In some cases, you MAY be able to register to vote online: [phone]    Control of Mold Allergen   Mold and fungi can grow on a variety of surfaces provided certain temperature and moisture conditions exist.  Outdoor molds grow on plants, decaying vegetation and soil.  The major outdoor mold, Alternaria and Cladosporium, are found in very high numbers during hot and dry conditions.  Generally, a late Summer - Fall peak is seen for common outdoor fungal spores.  Rain will temporarily lower outdoor mold spore count, but counts rise rapidly when the rainy period ends.  The most important indoor molds are Aspergillus and Penicillium.  Dark, humid and poorly ventilated basements are ideal sites for mold growth.  The next most common sites of mold growth are the bathroom and the kitchen.  Outdoor (Seasonal) Mold Control  Positive outdoor molds via skin testing: Bipolaris (Helminthsporium), Drechslera (Curvalaria) and Mucor  1. Use air conditioning and keep windows closed 2. Avoid exposure to decaying vegetation. 3. Avoid leaf raking. 4. Avoid grain handling. 5. Consider wearing a face mask if working in moldy areas.  6.   Indoor (Perennial) Mold Control   Positive indoor molds via skin testing: Aspergillus, Penicillium, Fusarium, Aureobasidium (Pullulara)  and Rhizopus  1. Maintain humidity below 50%. 2. Clean washable surfaces with 5% bleach  solution. 3. Remove sources e.g. contaminated carpets.    Control of Dog or Cat Allergen  Avoidance is the best way to manage a dog or cat allergy. If you have a dog or cat and are allergic to dog or cats, consider removing the dog or cat from the home. If you have a dog or cat but don't want to find it a new home, or if your family wants a pet even though someone in the household is allergic, here are some strategies that may help keep symptoms at bay:  1. Keep the pet out of your bedroom and restrict it to only a few rooms. Be advised that keeping the dog or cat in only one room will not limit the allergens to that room. 2. Don't pet, hug or kiss the dog or cat; if you do, wash your hands with soap and water. 3. High-efficiency particulate air (HEPA) cleaners run continuously in a bedroom or living room can reduce allergen levels over time. 4. Regular use of a high-efficiency vacuum cleaner or a central vacuum can reduce allergen levels. 5. Giving your dog or cat a bath at least once a week can reduce airborne allergen.  Control of Cockroach Allergen  Cockroach allergen has been identified as an important cause of acute attacks of asthma, especially in urban settings.  There are fifty-five species of cockroach that exist in the [place], however only three, the [place], [place] species produce allergen that can affect patients with Asthma.  Allergens can be obtained from fecal particles, egg casings and secretions from cockroaches.    1. Remove food sources. 2. Reduce access to water. 3. Seal access and entry points. 4. Spray runways with 0.5-1% Diazinon or Chlorpyrifos 5. Blow boric acid power under stoves and refrigerator. 6. Place bait stations (hydramethylnon) at feeding sites.  Control of Dust Mite Allergen    Dust mites play a major role in allergic asthma and rhinitis.  They occur in environments with high humidity wherever human skin is found.  Dust mites  absorb humidity from the atmosphere (ie, they do not drink) and feed on organic matter (including shed human and animal skin).  Dust mites are a microscopic type of insect that you cannot see with the naked eye.  High levels of dust mites have been detected from mattresses, pillows, carpets, upholstered furniture, bed covers, clothes, soft toys and any woven material.  The principal allergen of the dust mite is found in its feces.  A gram of dust may contain 1,000 mites and 250,000 fecal particles.  Mite antigen is easily measured in the air during house cleaning activities.  Dust mites do not bite and do not cause harm to humans, other than by triggering allergies/asthma.    Ways to decrease your exposure to dust mites in your home:  1. Encase mattresses, box springs and pillows with a mite-impermeable barrier or cover   2. Wash sheets, blankets and drapes weekly in hot water (130 F) with detergent and dry them in a dryer on the hot setting.  3. Have the room cleaned frequently with a vacuum cleaner and a damp dust-mop.  For carpeting or rugs, vacuuming with a vacuum cleaner equipped with a high-efficiency particulate air (HEPA) filter.  The dust mite allergic individual should not be in a room which is being cleaned and should wait 1 hour after cleaning before going into  the room. 4. Do not sleep on upholstered furniture (eg, couches).   5. If possible removing carpeting, upholstered furniture and drapery from the home is ideal.  Horizontal blinds should be eliminated in the rooms where the person spends the most time (bedroom, study, television room).  Washable vinyl, roller-type shades are optimal. 6. Remove all non-washable stuffed toys from the bedroom.  Wash stuffed toys weekly like sheets and blankets above.   7. Reduce indoor humidity to less than 50%.  Inexpensive humidity monitors can be purchased at most hardware stores.  Do not use a humidifier as can make the problem worse and are not  recommended.  Allergy Shots   Allergies are the result of a chain reaction that starts in the immune system. Your immune system controls how your body defends itself. For instance, if you have an allergy to pollen, your immune system identifies pollen as an invader or allergen. Your immune system overreacts by producing antibodies called Immunoglobulin E (IgE). These antibodies travel to cells that release chemicals, causing an allergic reaction.  The concept behind allergy immunotherapy, whether it is received in the form of shots or tablets, is that the immune system can be desensitized to specific allergens that trigger allergy symptoms. Although it requires time and patience, the payback can be long-term relief.  How Do Allergy Shots Work?  Allergy shots work much like a vaccine. Your body responds to injected amounts of a particular allergen given in increasing doses, eventually developing a resistance and tolerance to it. Allergy shots can lead to decreased, minimal or no allergy symptoms.  There generally are two phases: build-up and maintenance. Build-up often ranges from three to six months and involves receiving injections with increasing amounts of the allergens. The shots are typically given once or twice a week, though more rapid build-up schedules are sometimes used.  The maintenance phase begins when the most effective dose is reached. This dose is different for each person, depending on how allergic you are and your response to the build-up injections. Once the maintenance dose is reached, there are longer periods between injections, typically two to four weeks.  Occasionally doctors give cortisone-type shots that can temporarily reduce allergy symptoms. These types of shots are different and should not be confused with allergy immunotherapy shots.  Who Can Be Treated with Allergy Shots?  Allergy shots may be a good treatment approach for people with allergic rhinitis (hay fever),  allergic asthma, conjunctivitis (eye allergy) or stinging insect allergy.   Before deciding to begin allergy shots, you should consider:  . The length of allergy season and the severity of your symptoms . Whether medications and/or changes to your environment can control your symptoms . Your desire to avoid long-term medication use . Time: allergy immunotherapy requires a major time commitment . Cost: may vary depending on your insurance coverage  Allergy shots for children age 97 and older are effective and often well tolerated. They might prevent the onset of new allergen sensitivities or the progression to asthma.  Allergy shots are not started on patients who are pregnant but can be continued on patients who become pregnant while receiving them. In some patients with other medical conditions or who take certain common medications, allergy shots may be of risk. It is important to mention other medications you talk to your allergist.   When Will I Feel Better?  Some may experience decreased allergy symptoms during the build-up phase. For others, it may take as long as 12 months  on the maintenance dose. If there is no improvement after a year of maintenance, your allergist will discuss other treatment options with you.  If you aren't responding to allergy shots, it may be because there is not enough dose of the allergen in your vaccine or there are missing allergens that were not identified during your allergy testing. Other reasons could be that there are high levels of the allergen in your environment or major exposure to non-allergic triggers like tobacco smoke.  What Is the Length of Treatment?  Once the maintenance dose is reached, allergy shots are generally continued for three to five years. The decision to stop should be discussed with your allergist at that time. Some people may experience a permanent reduction of allergy symptoms. Others may relapse and a longer course of allergy  shots can be considered.  What Are the Possible Reactions?  The two types of adverse reactions that can occur with allergy shots are local and systemic. Common local reactions include very mild redness and swelling at the injection site, which can happen immediately or several hours after. A systemic reaction, which is less common, affects the entire body or a particular body system. They are usually mild and typically respond quickly to medications. Signs include increased allergy symptoms such as sneezing, a stuffy nose or hives.  Rarely, a serious systemic reaction called anaphylaxis can develop. Symptoms include swelling in the throat, wheezing, a feeling of tightness in the chest, nausea or dizziness. Most serious systemic reactions develop within 30 minutes of allergy shots. This is why it is strongly recommended you wait in your doctor's office for 30 minutes after your injections. Your allergist is trained to watch for reactions, and his or her staff is trained and equipped with the proper medications to identify and treat them.  Who Should Administer Allergy Shots?  The preferred location for receiving shots is your prescribing allergist's office. Injections can sometimes be given at another facility where the physician and staff are trained to recognize and treat reactions, and have received instructions by your prescribing allergist.

## 2019-08-17 NOTE — Progress Notes (Addendum)
NEW PATIENT  Date of Service/Encounter:  08/17/19  Referring provider: Joana Reamer, DO   Assessment:   Seasonal and perennial allergic rhinitis (indoor molds, outdoor molds, dust mites, cat, dog and cockroach)  Current smoker  Plan/Recommendations:   1. Seasonal and perennial allergic rhinitis - Testing today showed: indoor molds, outdoor molds, dust mites, cat, dog and cockroach - Copy of test results provided.  - Avoidance measures provided. - Continue with: Flonase (fluticasone) two sprays per nostril daily - Start taking: Allegra (fexofenadine) 180mg  tablet once daily OR Xyzal (levocetirizine) 5mg  tablet once daily in combination with Astelin (azelastine) 2 sprays per nostril 1-2 times daily as needed - You can use an extra dose of the antihistamine, if needed, for breakthrough symptoms.  - Consider nasal saline rinses 1-2 times daily to remove allergens from the nasal cavities as well as help with mucous clearance (this is especially helpful to do before the nasal sprays are given) - Consider allergy shots as a means of long-term control. - Allergy shots "re-train" and "reset" the immune system to ignore environmental allergens and decrease the resulting immune response to those allergens (sneezing, itchy watery eyes, runny nose, nasal congestion, etc).    - Allergy shots improve symptoms in 75-85% of patients.  - We can discuss more at the next appointment if the medications are not working for you.  2. Return in about 6 weeks (around 09/28/2019). This can be an in-person, a virtual Webex or a telephone follow up visit.  Subjective:   Juan Conley is a 39 y.o. male presenting today for evaluation of  Chief Complaint  Patient presents with   Allergic Rhinitis     past few months it has inproved, sinus cavity has pressure    Juan Conley has a history of the following: Patient Active Problem List   Diagnosis Date Noted   Superficial peroneal nerve  neuropathy, left 06/21/2019   Chronic sinusitis 06/21/2019   Tobacco use disorder 02/28/2007    History obtained from: chart review and patient.  06/23/2019 was referred by 03/02/2007, DO.     Juan Conley is a 39 y.o. male who presents today for seasonal allergies. Kannen states that over the past year he has noted postnasal drip, cough, nasal pain, and occasional ear pain. He states that his nasal pain feels, "Like a balloon that is expanding behind my nose and hurts." He states that his ear pain is associated with the nasal pain, and has had it looked at in the emergency department, with no findings on their physical examination. He states that his allergies occur during Spring, Summer, Fall, and Winter. It does appear to be worse in the Springtime.   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 He states that there have been renovations going on in the building since November 2020. He states that his symptoms occur both during renovations during the daytime, and when renovations were not occurring. For his nasal pain and postnasal drip he has tried several nasal rinses including Neti Pot, hydrogen peroxide, and colloidal silver with little relief. He currently takes Flonase now, with some improvement in his symptoms. For oral agents, he has tried Zyrtec, Claritin, and several other OTC products with little benefit. He has currently been taking Claritin for the past month and a half. He does take his Claritin and Flonase daily.   Otherwise, there is no history of other atopic diseases, including asthma,  food allergies, drug allergies, stinging insect allergies, eczema, urticaria or contact dermatitis. There is no significant infectious history. Vaccinations are up to date.    Past Medical History: Patient Active Problem List   Diagnosis Date Noted   Superficial peroneal nerve neuropathy, left 06/21/2019   Chronic sinusitis  06/21/2019   Tobacco use disorder 02/28/2007    Medication List:  Allergies as of 08/17/2019   No Known Allergies     Medication List       Accurate as of August 17, 2019  4:35 PM. If you have any questions, ask your nurse or doctor.        azelastine 0.1 % nasal spray Commonly known as: ASTELIN 2 sprays in each nostril 1-2 times daily as needed. Started by: Alfonse Spruce, MD   fluticasone 50 MCG/ACT nasal spray Commonly known as: FLONASE Place 2 sprays into both nostrils daily.   loratadine 10 MG tablet Commonly known as: CLARITIN Take 10 mg by mouth daily.   methocarbamol 500 MG tablet Commonly known as: Robaxin Take 1 tablet (500 mg total) by mouth every 8 (eight) hours as needed.   minoxidil 2 % external solution Commonly known as: ROGAINE Apply topically 2 (two) times daily.       Birth History: non-contributory  Developmental History: non-contributory  Past Surgical History: Past Surgical History:  Procedure Laterality Date   SPINE SURGERY  2020   WISDOM TOOTH EXTRACTION Bilateral 2001     Family History: Family History  Problem Relation Age of Onset   Mental illness Mother        Uncertain, schizoaffective or bipolar     Social History: [name] lives at home with his family.  They live in a house that was built in 1977.  There is carpeting throughout the home.  They have electric heating and central cooling.  He typically has no animals in the home, but he is currently babysitting his brother's dog.  There are no dust mite covers on the bedding.  There is tobacco exposure in the car, but not the house.  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  There are no fume, chemical, or dust exposures, although there is some ongoing renovations in the building where he works downtown.  He does not have a HEPA filter in the home.  He does not live near an interstate or industrial area.  He smoked from 2007 2019 and then restarted again  in November 2020.  He smokes 1 pack/day in the car and outside of the home.   Review of Systems  Constitutional: Negative.  Negative for chills, fever, malaise/fatigue and weight loss.  HENT: Positive for congestion and sinus pain. Negative for ear discharge and ear pain.        Positive for postnasal drip.  Positive for rhinorrhea.  Eyes: Negative for pain, discharge and redness.  Respiratory: Negative for cough, sputum production, shortness of breath and wheezing.   Cardiovascular: Negative.  Negative for chest pain and palpitations.  Gastrointestinal: Negative for abdominal pain, constipation, diarrhea, heartburn, nausea and vomiting.  Skin: Negative.  Negative for itching and rash.  Neurological: Negative for dizziness and headaches.  Endo/Heme/Allergies: Positive for environmental allergies. Does not bruise/bleed easily.       Objective:   Blood pressure 140/80, pulse (!) 107, temperature 98.5 F (36.9 C), height 5' 10.5" (1.791 m), weight 211 lb (95.7 kg), SpO2 95 %. Body mass index is 29.85 kg/m.   Physical Exam:  Physical Exam Constitutional:      Appearance: He is well-developed.  HENT:     Head: Normocephalic and atraumatic.     Right Ear: Tympanic membrane, ear canal and external ear normal. No drainage, swelling or tenderness. Tympanic membrane is not injected, scarred, erythematous, retracted or bulging.     Left Ear: Tympanic membrane, ear canal and external ear normal. No drainage, swelling or tenderness. Tympanic membrane is not injected, scarred, erythematous, retracted or bulging.     Nose: Mucosal edema and rhinorrhea present. No nasal deformity or septal deviation.     Right Turbinates: Enlarged and pale.     Left Turbinates: Enlarged and pale.     Right Sinus: No maxillary sinus tenderness or frontal sinus tenderness.     Left Sinus: No maxillary sinus tenderness or frontal sinus tenderness.     Mouth/Throat:     Mouth: Mucous membranes are not pale and  not dry.     Pharynx: Uvula midline.  Eyes:     General:        Right eye: No discharge.        Left eye: No discharge.     Conjunctiva/sclera: Conjunctivae normal.     Right eye: Right conjunctiva is not injected. No chemosis.    Left eye: Left conjunctiva is not injected. No chemosis.    Pupils: Pupils are equal, round, and reactive to light.  Cardiovascular:     Rate and Rhythm: Normal rate and regular rhythm.     Heart sounds: Normal heart sounds.  Pulmonary:     Effort: Pulmonary effort is normal. No tachypnea, accessory muscle usage or respiratory distress.     Breath sounds: Normal breath sounds. No wheezing, rhonchi or rales.     Comments: Moving air well in all lung fields.  No increased work of breathing. Chest:     Chest wall: No tenderness.  Abdominal:     Tenderness: There is no abdominal tenderness. There is no guarding or rebound.  Lymphadenopathy:     Head:     Right side of head: No submandibular, tonsillar or occipital adenopathy.     Left side of head: No submandibular, tonsillar or occipital adenopathy.     Cervical: No cervical adenopathy.  Skin:    Coloration: Skin is not pale.     Findings: No abrasion, erythema, petechiae or rash. Rash is not papular, urticarial or vesicular.     Comments: Eczematous or urticarial lesions noted.  Neurological:     Mental Status: He is alert.      Diagnostic studies:      Allergy Studies:     Airborne Adult Perc - 08/17/19 1010    Time Antigen Placed 1010    Allergen Manufacturer Waynette ButteryGreer    Location Back    Number of Test 59    Panel 1 Select    1. Control-Buffer 50% Glycerol Negative    2. Control-Histamine 1 mg/ml 3+    3. Albumin saline Negative    4. Bahia Negative    5. French Southern Territories[place]ermuda Negative    6. Johnson Negative    7. Kentucky Blue Negative    8. Meadow Fescue Negative    9. Perennial Rye Negative    10. Sweet Vernal Negative    11. Timothy Negative    12. Cocklebur Negative    13. Burweed Marshelder  Negative    14. Ragweed, short Negative    15. Ragweed, Giant Negative    16. Plantain,  English Negative  17. Lamb's Quarters Negative    18. Sheep Sorrell Negative    19. Rough Pigweed Negative    20. Marsh Elder, Rough Negative    21. Mugwort, Common Negative    22. Ash mix Negative    23. Birch mix Negative    24. Beech American Negative    25. Box, Elder Negative    26. Cedar, red Negative    27. Cottonwood, Guinea-Bissau Negative    28. Elm mix Negative    29. Hickory Negative    30. Maple mix Negative    31. Oak, Guinea-[place] mix Negative    32. Pecan Pollen Negative    33. Pine mix Negative    34. Sycamore Eastern Negative    35. Walnut, Black Pollen Negative    36. Alternaria alternata Negative    37. Cladosporium Herbarum Negative    38. Aspergillus mix Negative    39. Penicillium mix Negative    40. Bipolaris sorokiniana (Helminthosporium) Negative    41. Drechslera spicifera (Curvularia) Negative    42. Mucor plumbeus Negative    43. Fusarium moniliforme Negative    44. Aureobasidium pullulans (pullulara) Negative    45. Rhizopus oryzae Negative    46. Botrytis cinera Negative    47. Epicoccum nigrum Negative    48. Phoma betae Negative    49. Candida Albicans Negative    50. Trichophyton mentagrophytes Negative    51. Mite, D Farinae  5,000 AU/ml Negative    52. Mite, D Pteronyssinus  5,000 AU/ml Negative    53. Cat Hair 10,000 BAU/ml Negative    54.  Dog Epithelia Negative    55. Mixed Feathers Negative    56. Horse Epithelia Negative    57. Cockroach, German Negative    58. Mouse Negative    59. Tobacco Leaf Negative          Intradermal - 08/17/19 1037    Time Antigen Placed 1038    Allergen Manufacturer Waynette Buttery    Location Arm    Number of Test 15    Intradermal Select    Control Negative    French Southern Territories Negative    Johnson Negative    7 Grass Negative    Ragweed mix Negative    Weed mix Negative    Tree mix Negative    Mold 1 Negative    Mold 2 3+     Mold 3 3+    Mold 4 3+    Cat 3+    Dog 3+    Cockroach 3+    Mite mix 2+           Allergy testing results were read and interpreted by myself, documented by clinical staff.         Malachi Bonds, MD [facility]

## 2019-09-28 ENCOUNTER — Encounter: Payer: Self-pay | Admitting: Allergy & Immunology

## 2019-09-28 ENCOUNTER — Other Ambulatory Visit: Payer: Self-pay

## 2019-09-28 ENCOUNTER — Ambulatory Visit (INDEPENDENT_AMBULATORY_CARE_PROVIDER_SITE_OTHER): Payer: PRIVATE HEALTH INSURANCE | Admitting: Allergy & Immunology

## 2019-09-28 VITALS — BP 132/62 | HR 75 | Temp 98.5°F | Resp 20 | Ht 71.0 in | Wt 211.4 lb

## 2019-09-28 DIAGNOSIS — J3089 Other allergic rhinitis: Secondary | ICD-10-CM | POA: Diagnosis not present

## 2019-09-28 DIAGNOSIS — J302 Other seasonal allergic rhinitis: Secondary | ICD-10-CM

## 2019-09-28 NOTE — Patient Instructions (Addendum)
1. Seasonal and perennial allergic rhinitis (indoor molds, outdoor molds, dust mites, cat, dog and cockroach) - It seems that your symptoms are improving since the construction is almost completed.  - Continue taking: Allegra (fexofenadine) 180mg  tablet once daily OR Xyzal (levocetirizine) 5mg  tablet once daily in combination with Astelin (azelastine) 2 sprays per nostril 1-2 times daily as needed in combination with Flonase (fluticasone).  - You can use an extra dose of the antihistamine (Allegra or Xyzal), if needed, for breakthrough symptoms.  - Consider nasal saline rinses 1-2 times daily to remove allergens from the nasal cavities as well as help with mucous clearance (this is especially helpful to do before the nasal sprays are given) - We can discuss allergy shots in the future once your insurance coverage changes.   2. Return in about 1 year (around 09/27/2020).    Please inform of any Emergency Department visits, hospitalizations, or changes in symptoms. Call 09/29/2020 before going to the ED for breathing or allergy symptoms since we might be able to fit you in for a sick visit. Feel free to contact us anytime with any questions, problems, or concerns.  It was a pleasure to see you again today!  Websites that have reliable patient information: 1. American Academy of Asthma, Allergy, and Immunology: www.aaaai.org 2. Food Allergy Research and Education (FARE): foodallergy.org 3. Mothers of Asthmatics: http://www.asthmacommunitynetwork.org 4. American College of Allergy, Asthma, and Immunology: www.acaai.org   COVID-19 Vaccine Information can be found at: [place] For questions related to vaccine distribution or appointments, please email [email or web address] or call [phone].     "Like" [email or web address] on Facebook and Instagram for our latest updates!        Make sure you are registered to vote! If you have moved or  changed any of your contact information, you will need to get this updated before voting!  In some cases, you MAY be able to register to vote online: [phone]

## 2019-09-28 NOTE — Progress Notes (Signed)
FOLLOW UP  Date of Service/Encounter:  09/28/19   Assessment:   Seasonal and perennial allergic rhinitis (indoor molds, outdoor molds, dust mites, cat, dog and cockroach)  Current smoker  Plan/Recommendations:   1. Seasonal and perennial allergic rhinitis (indoor molds, outdoor molds, dust mites, cat, dog and cockroach) - It seems that your symptoms are improving since the construction is almost completed.  - Continue taking: Allegra (fexofenadine) 180mg  tablet once daily OR Xyzal (levocetirizine) 5mg  tablet once daily in combination with Astelin (azelastine) 2 sprays per nostril 1-2 times daily as needed in combination with Flonase (fluticasone).  - You can use an extra dose of the antihistamine (Allegra or Xyzal), if needed, for breakthrough symptoms.  - Consider nasal saline rinses 1-2 times daily to remove allergens from the nasal cavities as well as help with mucous clearance (this is especially helpful to do before the nasal sprays are given) - We can discuss allergy shots in the future once your insurance coverage changes.   2. Return in about 1 year (around 09/27/2020).   Subjective:   Juan Conley is a 39 y.o. male presenting today for follow up of  Chief Complaint  Patient presents with  . Allergic Rhinitis     Says his allergies have been a little better. He wants to know thw severity of what he's allergic to. He wants to know is Xyzal is better than Claritin    Juan Conley has a history of the following: Patient Active Problem List   Diagnosis Date Noted  . Superficial peroneal nerve neuropathy, left 06/21/2019  . Chronic sinusitis 06/21/2019  . Tobacco use disorder 02/28/2007    History obtained from: chart review and patient.  Juan Conley is a 39 y.o. male presenting for a follow up visit. He was last seen in July 2021 for a New Patient appointment. At that time, he had testing that was positive to indoor molds, outdoor molds, dust mites, cat, dog,  and cockroach. We continue with fluticasone and started Allegra or Xyzal as well as Astelin as needed.   Since the last visit,she has done well.  Overall, his symptoms have gotten somewhat better with the new combination of medications.  He would like allergy shots, but he does not think it is insurance would cover it well at all.  It does not seem that he is checked  He reports that this past week, he has had his brother's dog for the past two months. He can tell that his symptoms are worse around this dog. He has another friend who has a dog and babysits another dog and three cats. He has spent the night there. He has been fine without any problems. The bedroom that he sleeps in is one where the cats eat and sleep on the beds. He was totally cool there for the most part.  However, the second night he was there, he had a severe allergy attack.  He does sometimes take an additional medication such as Mucinex when he has issues.  Occasionally he will take a prophylactic antihistamine as well when he knows he is going to be around triggering environments.  He does use the fluticasone every day.  He tends to use that midday.  He starts the morning with Astelin.  He has not needed antibiotics at all since last visit.  Otherwise, there have been no changes to his past medical history, surgical history, family history, or social history.    Review of Systems  Constitutional: Negative.  Negative for chills, fever, malaise/fatigue and weight loss.  HENT: Negative.  Negative for congestion, ear discharge and ear pain.   Eyes: Negative for pain, discharge and redness.  Respiratory: Negative for cough, sputum production, shortness of breath and wheezing.   Cardiovascular: Negative.  Negative for chest pain and palpitations.  Gastrointestinal: Negative for abdominal pain, heartburn, nausea and vomiting.  Skin: Negative.  Negative for itching and rash.  Neurological: Negative for dizziness and headaches.    Endo/Heme/Allergies: Negative for environmental allergies. Does not bruise/bleed easily.       Objective:   Blood pressure 132/62, pulse 75, temperature 98.5 F (36.9 C), resp. rate 20, height 5\' 11"  (1.803 m), weight 211 lb 6.4 oz (95.9 kg), SpO2 96 %. Body mass index is 29.48 kg/m.   Physical Exam:  Physical Exam Constitutional:      Appearance: He is well-developed.  HENT:     Head: Normocephalic and atraumatic.     Right Ear: Tympanic membrane, ear canal and external ear normal.     Left Ear: Tympanic membrane and ear canal normal.     Nose: No nasal deformity, septal deviation, mucosal edema or rhinorrhea.     Right Turbinates: Swollen and pale.     Left Turbinates: Swollen and pale.     Right Sinus: No maxillary sinus tenderness or frontal sinus tenderness.     Left Sinus: No maxillary sinus tenderness or frontal sinus tenderness.     Comments: Turbinates erythematous bilaterally. No discharge.    Mouth/Throat:     Mouth: Mucous membranes are not pale and not dry.     Pharynx: Uvula midline.  Eyes:     General:        Right eye: No discharge.        Left eye: No discharge.     Conjunctiva/sclera: Conjunctivae normal.     Right eye: Right conjunctiva is not injected. No chemosis.    Left eye: Left conjunctiva is not injected. No chemosis.    Pupils: Pupils are equal, round, and reactive to light.  Cardiovascular:     Rate and Rhythm: Normal rate and regular rhythm.     Heart sounds: Normal heart sounds.  Pulmonary:     Effort: Pulmonary effort is normal. No tachypnea, accessory muscle usage or respiratory distress.     Breath sounds: Normal breath sounds. No wheezing, rhonchi or rales.     Comments: Moving air well in all lung fields.  No increased work of breathing. Chest:     Chest wall: No tenderness.  Lymphadenopathy:     Cervical: No cervical adenopathy.  Skin:    Coloration: Skin is not pale.     Findings: No abrasion, erythema, petechiae or rash. Rash  is not papular, urticarial or vesicular.  Neurological:     Mental Status: He is alert.      Diagnostic studies: none    , MD  [facility]

## 2020-02-21 IMAGING — XA DG INJECT/[PERSON_NAME] INC NEEDLE/CATH/PLC EPI/CERV/THOR W/IMG
2 series · 2 of 2 positions shown · non-contrast
Comparison: none

CLINICAL DATA: Degenerative spondylosis C3-4 and C4-5. Right neck
and shoulder discomfort.

[Series 1: ortho standard · 1 of 1 slices shown (1 of 2)]
[im 1/1]
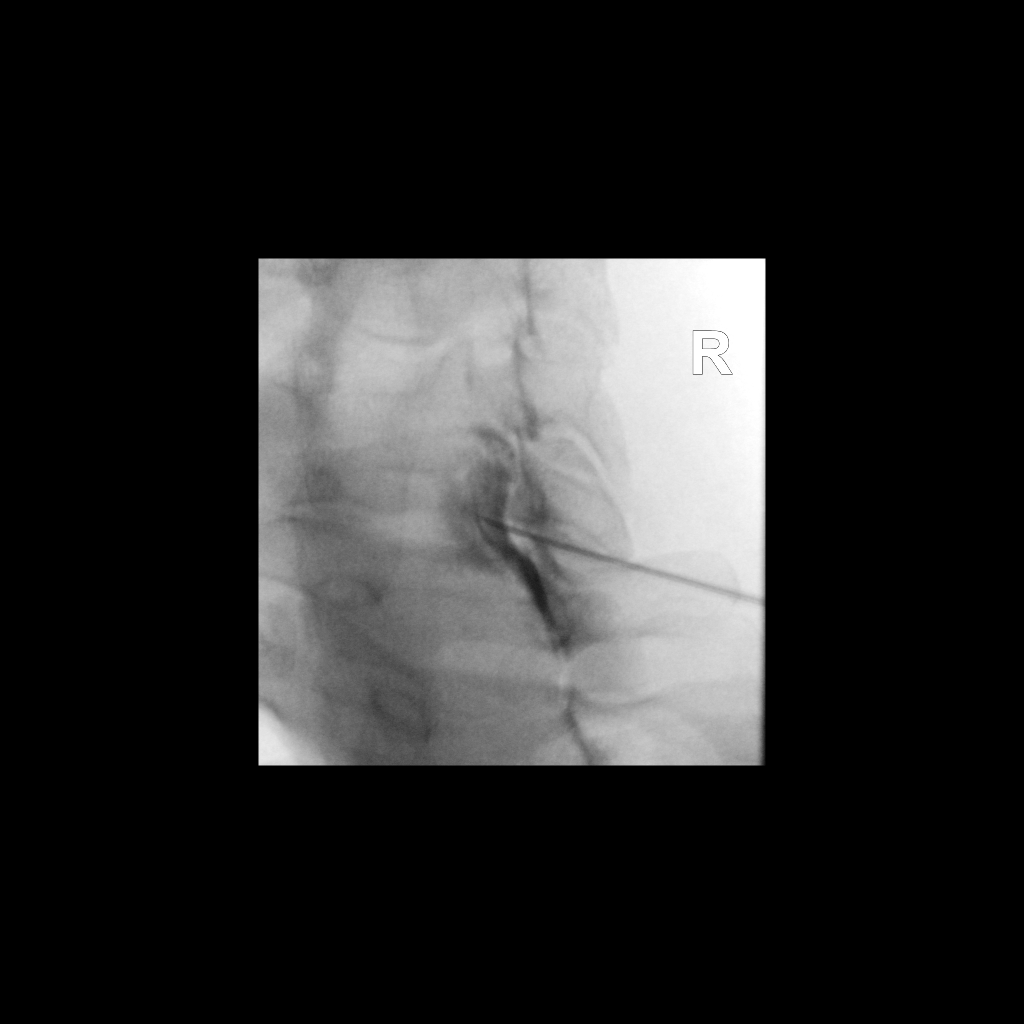

[Series 2: ortho standard · 1 of 1 slices shown (2 of 2)]
[im 1/1]
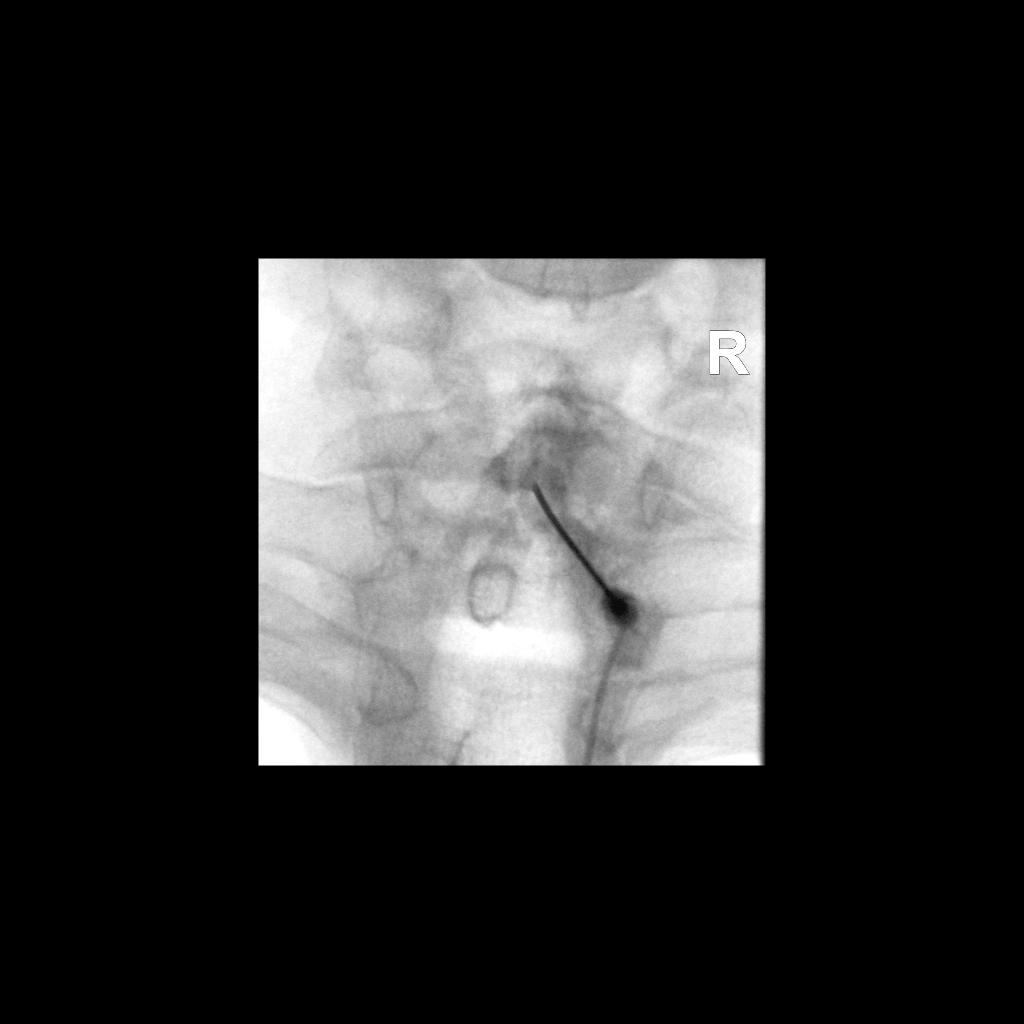

[2 of 2 positions shown; findings below may reference images not displayed]

FLUOROSCOPY TIME:  0 minutes 40 seconds. 19.13 micro gray meter
squared

PROCEDURE:
CERVICAL EPIDURAL INJECTION

An interlaminar approach was performed on the right at C7. Due to a
paucity of epidural fat, I did not want to go higher than that for
the needle introduction. A 20 gauge epidural needle was advanced
using loss-of-resistance technique.

DIAGNOSTIC EPIDURAL INJECTION

Injection of Isovue-M 300 shows a good epidural pattern with spread
above and below the level of needle placement, primarily on the
right. No vascular opacification is seen. THERAPEUTIC

EPIDURAL INJECTION

1.5 ml of Kenalog 40 mixed with 1 ml of 1% Lidocaine and 2 ml of
normal saline were then instilled. The procedure was well-tolerated,
and the patient was discharged thirty minutes following the
injection in good condition.
IMPRESSION: Technically successful initial epidural injection on the right at
C7.

## 2020-08-16 ENCOUNTER — Other Ambulatory Visit: Payer: Self-pay | Admitting: Allergy & Immunology

## 2020-09-26 ENCOUNTER — Encounter: Payer: Self-pay | Admitting: Allergy & Immunology

## 2020-09-26 ENCOUNTER — Other Ambulatory Visit: Payer: Self-pay

## 2020-09-26 ENCOUNTER — Ambulatory Visit: Payer: PRIVATE HEALTH INSURANCE | Admitting: Allergy & Immunology

## 2020-09-26 VITALS — BP 132/80 | HR 71 | Temp 98.6°F | Resp 16 | Ht 71.0 in | Wt 201.0 lb

## 2020-09-26 DIAGNOSIS — J302 Other seasonal allergic rhinitis: Secondary | ICD-10-CM

## 2020-09-26 DIAGNOSIS — J3089 Other allergic rhinitis: Secondary | ICD-10-CM

## 2020-09-26 NOTE — Patient Instructions (Addendum)
1. Seasonal and perennial allergic rhinitis (indoor molds, outdoor molds, dust mites, cat, dog and cockroach) - It seems that you have a pretty good handle on your symptoms. - Continue taking: Xyzal (levocetirizine) 5mg  tablet once daily in combination with Astelin (azelastine) 2 sprays per nostril 1-2 times daily as needed in combination with Flonase (fluticasone).  - You can use an extra dose of the Xyzal, if needed, for breakthrough symptoms.  - Consider nasal saline rinses 1-2 times daily to remove allergens from the nasal cavities as well as help with mucous clearance (this is especially helpful to do before the nasal sprays are given) - CPT codes provided in case you decide to start shots with the new job.  2. Return in about 1 year (around 09/26/2021).    Please inform 09/28/2021 of any Emergency Department visits, hospitalizations, or changes in symptoms. Call us before going to the ED for breathing or allergy symptoms since we might be able to fit you in for a sick visit. Feel free to contact us anytime with any questions, problems, or concerns.  It was a pleasure to see you again today!  Websites that have reliable patient information: 1. American Academy of Asthma, Allergy, and Immunology: www.aaaai.org 2. Food Allergy Research and Education (FARE): foodallergy.org 3. Mothers of Asthmatics: http://www.asthmacommunitynetwork.org 4. American College of Allergy, Asthma, and Immunology: www.acaai.org   COVID-19 Vaccine Information can be found at: [place] For questions related to vaccine distribution or appointments, please email [email or web address] or call [phone].     "Like" [phone] on Facebook and Instagram for our latest updates!        Make sure you are registered to vote! If you have moved or changed any of your contact information, you will need to get this updated before voting!  In some cases, you MAY be  able to register to vote online: [place]

## 2020-09-26 NOTE — Progress Notes (Signed)
FOLLOW UP  Date of Service/Encounter:  09/26/20   Assessment:   Seasonal and perennial allergic rhinitis (indoor molds, outdoor molds, dust mites, cat, dog and cockroach) - fairly well controlled with the Xyzal and azelastine   Current smoker  Plan/Recommendations:   1. Seasonal and perennial allergic rhinitis (indoor molds, outdoor molds, dust mites, cat, dog and cockroach) - It seems that you have a pretty good handle on your symptoms. - Continue taking: Xyzal (levocetirizine) 5mg  tablet once daily in combination with Astelin (azelastine) 2 sprays per nostril 1-2 times daily as needed in combination with Flonase (fluticasone).  - You can use an extra dose of the Xyzal, if needed, for breakthrough symptoms.  - Consider nasal saline rinses 1-2 times daily to remove allergens from the nasal cavities as well as help with mucous clearance (this is especially helpful to do before the nasal sprays are given) - CPT codes provided in case you decide to start shots with the new job.  2. Return in about 1 year (around 09/26/2021).    Subjective:   Juan Conley is a 40 y.o. male presenting today for follow up of  Chief Complaint  Patient presents with   Follow-up    BRENDA COWHER has a history of the following: Patient Active Problem List   Diagnosis Date Noted   Superficial peroneal nerve neuropathy, left 06/21/2019   Chronic sinusitis 06/21/2019   Tobacco use disorder 02/28/2007    History obtained from: chart review and patient.  Juan Conley is a 40 y.o. male presenting for a follow up visit.  He was last seen 1 year ago.  At that time, he was doing well with his combination of nose sprays and Allegra or Xyzal.  He felt that Xyzal was working better and decided to stick with about 1.  His insurance coverage is not great, so we did not do allergy shots although he was interested.  Since the last visit, he has done well. He is still taking care of his brother's dog and this  does not seem to bother him at all. His best friend has multiple dogs and cats and they do not seem to bother him at all.   He remains on the Xyzal and the azelastine. He does this throughout the year, pretty much daily. He did get an ear infection around four weeks ago. This was right after the rains that we had. He went to an Urgent Care. He was placed on amoxicillin. This was his first antibiotic in several years. This was just the right ear. He had some fluid on the left side but the right side was predominant. He denies rupture.  Overall he is doing fairly well. It is mostly under control. He thinks that the thing that bothers him is the mold. He continues to work in [employer] in [employer]. He has some interviews and offers. 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 He does report that he is going to have hopefully better insurance coverage with this next job  He actually has a degree in both theology and [employer].  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  Otherwise, there have been no changes to his past medical history, surgical history, family history, or social history.    Review of Systems  Constitutional: Negative.  Negative for fever, malaise/fatigue and weight loss.  HENT:  Positive for congestion. Negative for ear discharge  and ear pain.        Positive for postnasal drip.  Eyes:  Negative for pain, discharge and redness.  Respiratory:  Negative for cough, sputum production, shortness of breath and wheezing.   Cardiovascular: Negative.  Negative for chest pain and palpitations.  Gastrointestinal:  Negative for abdominal pain, constipation, diarrhea, heartburn, nausea and vomiting.  Skin: Negative.  Negative for itching and rash.  Neurological:  Negative for dizziness and headaches.  Endo/Heme/Allergies:  Negative for environmental allergies. Does not bruise/bleed easily.      Objective:    Blood pressure 132/80, pulse 71, temperature 98.6 F (37 C), temperature source Temporal, resp. rate 16, height 5\' 11"  (1.803 m), weight 201 lb (91.2 kg), SpO2 95 %. Body mass index is 28.03 kg/m.   Physical Exam:  Physical Exam Vitals reviewed.  Constitutional:      Appearance: He is well-developed.  HENT:     Head: Normocephalic and atraumatic.     Right Ear: Tympanic membrane, ear canal and external ear normal.     Left Ear: Tympanic membrane, ear canal and external ear normal.     Nose: No nasal deformity, septal deviation, mucosal edema or rhinorrhea.     Right Turbinates: Enlarged and swollen.     Left Turbinates: Enlarged and swollen.     Right Sinus: No maxillary sinus tenderness or frontal sinus tenderness.     Left Sinus: No maxillary sinus tenderness or frontal sinus tenderness.     Mouth/Throat:     Mouth: Mucous membranes are not pale and not dry.     Pharynx: Uvula midline.  Eyes:     General: Lids are normal. No allergic shiner.       Right eye: No discharge.        Left eye: No discharge.     Conjunctiva/sclera: Conjunctivae normal.     Right eye: Right conjunctiva is not injected. No chemosis.    Left eye: Left conjunctiva is not injected. No chemosis.    Pupils: Pupils are equal, round, and reactive to light.  Cardiovascular:     Rate and Rhythm: Normal rate and regular rhythm.     Heart sounds: Normal heart sounds.  Pulmonary:     Effort: Pulmonary effort is normal. No tachypnea, accessory muscle usage or respiratory distress.     Breath sounds: Normal breath sounds. No wheezing, rhonchi or rales.     Comments: Moving air well in all lung fields.  No increased work of breathing. Chest:     Chest wall: No tenderness.  Lymphadenopathy:     Cervical: No cervical adenopathy.  Skin:    Coloration: Skin is not pale.     Findings: No abrasion, erythema, petechiae or rash. Rash is not papular, urticarial or vesicular.  Neurological:     Mental Status: He  is alert.  Psychiatric:        Behavior: Behavior is cooperative.     Diagnostic studies: none    , MD  [facility]

## 2020-12-08 ENCOUNTER — Other Ambulatory Visit: Payer: Self-pay | Admitting: Allergy & Immunology

## 2021-07-08 ENCOUNTER — Encounter: Payer: Self-pay | Admitting: *Deleted
